# Patient Record
Sex: Male | Born: 1970 | Race: White | Hispanic: No | Marital: Single | Smoking: Current every day smoker
Health system: Southern US, Community
[De-identification: ages and names within clinical notes are randomized; demographics above are authoritative.]

---

## 2004-01-11 ENCOUNTER — Ambulatory Visit (HOSPITAL_COMMUNITY): Admission: RE | Admit: 2004-01-11 | Discharge: 2004-01-11 | Payer: Self-pay | Admitting: Family Medicine

## 2004-03-07 ENCOUNTER — Ambulatory Visit (HOSPITAL_COMMUNITY): Admission: RE | Admit: 2004-03-07 | Discharge: 2004-03-07 | Payer: Self-pay | Admitting: Family Medicine

## 2007-01-26 ENCOUNTER — Emergency Department (HOSPITAL_COMMUNITY): Admission: EM | Admit: 2007-01-26 | Discharge: 2007-01-26 | Payer: Self-pay | Admitting: Emergency Medicine

## 2007-02-01 ENCOUNTER — Emergency Department (HOSPITAL_COMMUNITY): Admission: EM | Admit: 2007-02-01 | Discharge: 2007-02-01 | Payer: Self-pay | Admitting: Emergency Medicine

## 2020-06-14 ENCOUNTER — Other Ambulatory Visit: Payer: Self-pay | Admitting: Family Medicine

## 2020-06-14 DIAGNOSIS — R519 Headache, unspecified: Secondary | ICD-10-CM

## 2020-07-06 ENCOUNTER — Ambulatory Visit (INDEPENDENT_AMBULATORY_CARE_PROVIDER_SITE_OTHER): Payer: BC Managed Care – PPO | Admitting: Otolaryngology

## 2020-07-06 ENCOUNTER — Other Ambulatory Visit: Payer: Self-pay

## 2020-07-06 ENCOUNTER — Encounter (INDEPENDENT_AMBULATORY_CARE_PROVIDER_SITE_OTHER): Payer: Self-pay | Admitting: Otolaryngology

## 2020-07-06 ENCOUNTER — Ambulatory Visit
Admission: RE | Admit: 2020-07-06 | Discharge: 2020-07-06 | Disposition: A | Discharge status: Home / Self Care | Payer: BC Managed Care – PPO | Source: Ambulatory Visit | Attending: Family Medicine | Admitting: Family Medicine

## 2020-07-06 VITALS — Temp 97.0°F

## 2020-07-06 DIAGNOSIS — K130 Diseases of lips: Secondary | ICD-10-CM | POA: Diagnosis not present

## 2020-07-06 DIAGNOSIS — H9313 Tinnitus, bilateral: Secondary | ICD-10-CM | POA: Diagnosis not present

## 2020-07-06 DIAGNOSIS — R519 Headache, unspecified: Secondary | ICD-10-CM

## 2020-07-06 NOTE — Progress Notes (Signed)
HPI: Matthew Barron is a 50 y.o. male who presents is referred by his PCP for evaluation of right lower lip nodule.  Patient states that this developed rather quickly.  He used to be much larger but is subsequently gone down in size. He also complains of chronic ringing in the ears as well as occasional headaches. He works in a noisy environment at [employer] in [place] where he has to use earplugs while working and gets yearly hearing evaluations which he has passed. He does smoke a half of a pack to a pack a day.  No past medical history on file.  Social History   Socioeconomic History  . Marital status: Single    Spouse name: Not on file  . Number of children: Not on file  . Years of education: Not on file  . Highest education level: Not on file  Occupational History  . Not on file  Tobacco Use  . Smoking status: Current Every Day Smoker    Packs/day: 0.50    Years: 27.00    Pack years: 13.50    Start date: 13  . Smokeless tobacco: Never Used  Substance and Sexual Activity  . Alcohol use: Not on file  . Drug use: Not on file  . Sexual activity: Not on file  Other Topics Concern  . Not on file  Social History Narrative  . Not on file   Social Determinants of Health   Financial Resource Strain: Not on file  Food Insecurity: Not on file  Transportation Needs: Not on file  Physical Activity: Not on file  Stress: Not on file  Social Connections: Not on file   No family history on file. No Known Allergies Prior to Admission medications   Not on File     Positive ROS: Otherwise negative  All other systems have been reviewed and were otherwise negative with the exception of those mentioned in the HPI and as above.  Physical Exam: Constitutional: Alert, well-appearing, no acute distress Ears: External ears without lesions or tenderness. Ear canals are clear bilaterally with intact, clear TMs.  On hearing screening with the 1024 tuning fork he has good hearing  in both ears.  Auscultation of ears revealed no objective tinnitus. Nasal: External nose without lesions. Septum slightly deviated to the right with mild rhinitis.  Both millimeters regions were clear.  No polyps and no signs of infection..  Oral: Lips and gums without lesions. Tongue and palate mucosa without lesions. Posterior oropharynx clear.  Tonsil regions are benign in appearance bilaterally.  Indirect laryngoscopy revealed a clear base of tongue vallecula and epiglottis.  Vocal cords were clear bilaterally.  On palpation of the right lower lip is a very small nodule in the right lower lip consistent with resolved right minor salivary gland mucocele.  Mucosa was normal to examination.. Neck: No palpable adenopathy or masses.  No submental or submandibular adenopathy noted. Respiratory: Breathing comfortably  Skin: No facial/neck lesions or rash noted.  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 Tinnitus most likely related to previous noise exposure and high-frequency sensorineural hearing loss. Clear upper airway examination.  Plan: Discussed with patient concerning mucocele of the right lower lip which is benign and would not warrant removal unless it persists and causes him problems. Concerning tinnitus reviewed with him that there is not great treatment for tinnitus.  I discussed with him concerning using masking noise when the tinnitus is bothersome and trying to limit noise exposure  which will make tinnitus worse. He also complained of some dry sensation in the nose which sometimes triggers his headaches and suggested using saline rinse or nasal gel spray in the nasal passageway if he is having any nasal sinus symptoms.  But there are no abnormalities within the nasal cavity and no signs of infection.   Narda Bonds, MD   CC:

## 2020-10-16 ENCOUNTER — Ambulatory Visit: Payer: BC Managed Care – PPO | Admitting: Neurology

## 2020-10-16 ENCOUNTER — Encounter: Payer: Self-pay | Admitting: Neurology

## 2020-10-16 ENCOUNTER — Other Ambulatory Visit: Payer: Self-pay

## 2020-10-16 VITALS — BP 146/83 | HR 85 | Ht 69.0 in | Wt 140.5 lb

## 2020-10-16 DIAGNOSIS — G44221 Chronic tension-type headache, intractable: Secondary | ICD-10-CM

## 2020-10-16 MED ORDER — TOPIRAMATE 50 MG PO TABS: 50.0000 mg | ORAL_TABLET | Freq: Every day | ORAL | 3 refills | Status: DC

## 2020-10-16 MED ORDER — SUMATRIPTAN SUCCINATE 50 MG PO TABS: 50.0000 mg | ORAL_TABLET | ORAL | 3 refills | Status: AC | PRN

## 2020-10-16 NOTE — Progress Notes (Signed)
[facility]  PATIENT: Matthew Barron DOB: 1970/05/16  REFERRING CLINICIAN: Koirala, Dibas, MD HISTORY FROM: Patient  REASON FOR VISIT: Headaches    HISTORICAL  CHIEF COMPLAINT:  Chief Complaint  Patient presents with   Headache    New Patient: paper referral, Headaches x 1.5 years Room 13, alone in room    HISTORY OF PRESENT ILLNESS:  This is a 50 year old man with no past medical history who is presenting for headaches.  Patient stated he has a history of headache for the past 1.5-2 years after having COVID19 infection. He reported that he never had headaches in the past.  Headaches are located on the left side of his temple described as aching pain.  He denies any associated nausea, no vomiting, no photophobia or phonophobia.  When he gets the headaches, he usually takes Excedrin and the pain usually goes away within 30 minutes.  Usually  he is having 2-3 headaches days per week. 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  During his headache work-up he did have an MRI which did not show any intracranial abnormality but showed a right maxillary sinus cyst.  He did follow-up with ENT who reassured patient that the maxillary sinus cyst is not the reason for his headache.  He also reported the ENT told him that his ear nose and throat were all clear. Patient has a history of Lasix surgery about 5 years ago.  He reported currently he is having trouble reading fine prints.  He did try some reading glasses but it was not helpful. He has not followed up with ophthalmologist for many years.   Headache History and Characteristics: Onset: 1.5-2 years ago, no previous history of headaches Location: Around left temple  Quality: Pressure behind eye, stabbing, throbbing.  Intensity: 6/10.  Duration: couple hours  Migrainous Features: No  Aura: No   History of brain injury or tumor: No  Previous Treatment:  Prophylactic: None  Abortive: Excedrin  Injections: None    Family history: Motion sickness: no Cardiac history: no  OTC: Excedrin Caffeine:yes  Sleep: normal  Mood/ Stress: normal   Prior prophylaxis: Propranolol: No  Verapamil:No TCA: No Topamax: No Depakote: No Effexor: No Cymbalta: No Neurontin:No  Prior abortives: Triptan: No Anti-emetic: No Steroids: No Ergotamine suppository: No   OTHER MEDICAL CONDITIONS: None reported    REVIEW OF SYSTEMS: Full 14 system review of systems performed and negative with exception of: as noted in the HPI  ALLERGIES: No Known Allergies  HOME MEDICATIONS: Outpatient Medications Prior to Visit  Medication Sig Dispense Refill   atorvastatin (LIPITOR) 10 MG tablet Take 10 mg by mouth daily.     No facility-administered medications prior to visit.    PAST MEDICAL HISTORY: History reviewed. No pertinent past medical history.  PAST SURGICAL HISTORY: History reviewed. No pertinent surgical history.  FAMILY HISTORY: History reviewed. No pertinent family history.  SOCIAL HISTORY: Social History   Socioeconomic History   Marital status: Single    Spouse name: Not on file   Number of children: Not on file   Years of education: Not on file   Highest education level: Not on file  Occupational History   Not on file  Tobacco Use   Smoking status: Every Day    Packs/day: 0.50    Years: 27.00  Pack years: 13.50    Types: Cigarettes    Start date: 1995   Smokeless tobacco: Never  Substance and Sexual Activity   Alcohol use: Never   Drug use: Never   Sexual activity: Not on file  Other Topics Concern   Not on file  Social History Narrative   Lives alone    Right Handed   Drinks >10 cups caffeine   Social Determinants of Health   Financial Resource Strain: Not on file  Food Insecurity: Not on file  Transportation Needs: Not on file  Physical  Activity: Not on file  Stress: Not on file  Social Connections: Not on file  Intimate Partner Violence: Not on file     PHYSICAL EXAM  GENERAL EXAM/CONSTITUTIONAL: Vitals:  Vitals:   10/16/20 1359  BP: (!) 146/83  Pulse: 85  Weight: 140 lb 8 oz (63.7 kg)  Height: $Remove'5\' 9"'[id number]$  (1.753 m)   Body mass index is 20.75 kg/m. Wt Readings from Last 3 Encounters:  10/16/20 140 lb 8 oz (63.7 kg)   Patient is in no distress; well developed, nourished and groomed; neck is supple  CARDIOVASCULAR: Examination of carotid arteries is normal; no carotid bruits Regular rate and rhythm, no murmurs Examination of peripheral vascular system by observation and palpation is normal  EYES: Pupils round and reactive to light, Visual fields full to confrontation, Extraocular movements intacts,   MUSCULOSKELETAL: Gait, strength, tone, movements noted in Neurologic exam below  NEUROLOGIC: MENTAL STATUS:  awake, alert, oriented to person, place and time recent and remote memory intact normal attention and concentration language fluent, comprehension intact, naming intact fund of knowledge appropriate  CRANIAL NERVE:  2nd - no papilledema or hemorrhages on fundoscopic exam 2nd, 3rd, 4th, 6th - pupils equal and reactive to light, visual fields full to confrontation, extraocular muscles intact, no nystagmus 5th - facial sensation symmetric 7th - facial strength symmetric 8th - hearing intact 9th - palate elevates symmetrically, uvula midline 11th - shoulder shrug symmetric 12th - tongue protrusion midline  MOTOR:  normal bulk and tone, full strength in the BUE, BLE  SENSORY:  normal and symmetric to light touch, pinprick, temperature, vibration  COORDINATION:  finger-nose-finger, fine finger movements normal  REFLEXES:  deep tendon reflexes present and symmetric  GAIT/STATION:  normal   DIAGNOSTIC DATA (LABS, IMAGING, TESTING) - I reviewed patient records, labs, notes, testing and  imaging myself where available.  No results found for: WBC, HGB, HCT, MCV, PLT No results found for: NA, K, CL, CO2, GLUCOSE, BUN, CREATININE, CALCIUM, PROT, ALBUMIN, AST, ALT, ALKPHOS, BILITOT, GFRNONAA, GFRAA No results found for: CHOL, HDL, LDLCALC, LDLDIRECT, TRIG, CHOLHDL No results found for: HGBA1C No results found for: VITAMINB12 No results found for: TSH  MRI Brain 07/2020: Unremarkable non-contrast MRI appearance of the brain. No evidence of acute intracranial abnormality.   ASSESSMENT AND PLAN  50 y.o. year old male  with no past medical history who is presenting for headaches. Patient states that the headache is on the left side, left temporal region with no associated migrainous features.  He has tried Excedrin with some relief.  He did have an MRI brain which showed no evidence cranial abnormality but showed a right maxillary cyst.  He follow-up with ENT and was told the cyst is not the cause for his headaches.  Patient met criteria for a diagnosis of chronic tension type headaches, we will start him on preventive medication topiramate 50 mg nightly and sumatriptan as abortive medication.  I  also recommend patient to continue with Excedrin, Tylenol and Motrin as needed for headache.  I will see him in 3 months.  I also strongly recommended patient to follow-up with ophthalmology to have his annual eye exam as he is reporting trouble with fine print.    1. Chronic tension-type headache, intractable     PLAN: Start with 1/2 tab Topiramate for one week then increase to one tab thereafter  Take Sumatriptan as soon as the headaches start, can take an additional one in 2 hours. Do not take more than 2 tablets per day  Continue with Excedrin, Tylenol or Ibuprofen as needed for the headaches but do not take more than 3 days per week  Follow up with ophthalmologist for annual eye exam  Return to clinic in 3 months   No orders of the defined types were placed in this  encounter.   Meds ordered this encounter  Medications   topiramate (TOPAMAX) 50 MG tablet    Sig: Take 1 tablet (50 mg total) by mouth daily.    Dispense:  90 tablet    Refill:  3   SUMAtriptan (IMITREX) 50 MG tablet    Sig: Take 1 tablet (50 mg total) by mouth every 2 (two) hours as needed for migraine. May repeat in 2 hours if headache persists or recurs.    Dispense:  10 tablet    Refill:  3    Return in about 3 months (around 01/16/2021).    Alric Ran, MD 10/16/2020, 2:43 PM  [facility] [address] [place], [place] [address] [phone]

## 2020-10-16 NOTE — Patient Instructions (Addendum)
Start with 1/2 tab Topiramate for one week then increase to one tab thereafter  Take Sumatriptan as soon as the headaches start, can take an additional one in 2 hours. Do not take more than 2 tablets per day  Continue with Excedrin, Tylenol or Ibuprofen as needed for the headaches but do not take more than 3 days per week  Follow up with ophthalmologist for annual eye exam  Return to clinic in 3 months

## 2021-01-17 ENCOUNTER — Encounter: Payer: Self-pay | Admitting: Neurology

## 2021-01-17 ENCOUNTER — Other Ambulatory Visit: Payer: Self-pay

## 2021-01-17 ENCOUNTER — Ambulatory Visit: Payer: BC Managed Care – PPO | Admitting: Neurology

## 2021-01-17 VITALS — BP 132/82 | HR 60 | Ht 69.0 in | Wt 140.5 lb

## 2021-01-17 DIAGNOSIS — G44221 Chronic tension-type headache, intractable: Secondary | ICD-10-CM

## 2021-01-17 NOTE — Progress Notes (Signed)
[facility]  PATIENT: Matthew Barron DOB: 12/05/1970  REFERRING CLINICIAN: Koirala, Dibas, MD HISTORY FROM: Patient  REASON FOR VISIT: Headaches    HISTORICAL  CHIEF COMPLAINT:  Chief Complaint  Patient presents with   Follow-up    Room 12. Alone. Patient states he is stable.    INTERVAL HISTORY 01/17/2021 Patient presents today for follow-up, plan at last visit was to start topiramate for preventive medication and sumatriptan for abortive medication.  Stated the headaches ar stable, he self discontinued Topamax due to daytime sleepiness.  Reported sumatriptan's helps a lot for the headaches, he only takes it when he has a bad headache, otherwise he takes Excedrin.     HISTORY OF PRESENT ILLNESS:  This is a 50 year old man with no past medical history who is presenting for headaches.  Patient stated he has a history of headache for the past 1.5-2 years after having COVID19 infection. He reported that he never had headaches in the past.  Headaches are located on the left side of his temple described as aching pain.  He denies any associated nausea, no vomiting, no photophobia or phonophobia.  When he gets the headaches, he usually takes Excedrin and the pain usually goes away within 30 minutes.  Usually  he is having 2-3 headaches days per week. 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  During his headache work-up he did have an MRI which did not show any intracranial abnormality but showed a right maxillary sinus cyst.  He did follow-up with ENT who reassured patient that the maxillary sinus cyst is not the reason for his headache.  He also reported the ENT told him that his ear nose and throat were all clear. Patient has a history of Lasix surgery about 5 years ago.  He reported currently he is having trouble reading fine prints.  He did  try some reading glasses but it was not helpful. He has not followed up with ophthalmologist for many years.   Headache History and Characteristics: Onset: 1.5-2 years ago, no previous history of headaches Location: Around left temple  Quality: Pressure behind eye, stabbing, throbbing.  Intensity: 6/10.  Duration: couple hours  Migrainous Features: No  Aura: No  History of brain injury or tumor: No  Previous Treatment:  Prophylactic: None  Abortive: Excedrin  Injections: None    Family history: Motion sickness: no Cardiac history: no  OTC: Excedrin Caffeine:yes  Sleep: normal  Mood/ Stress: normal   Prior prophylaxis: Propranolol: No  Verapamil:No TCA: No Topamax: No Depakote: No Effexor: No Cymbalta: No Neurontin:No  Prior abortives: Triptan: No Anti-emetic: No Steroids: No Ergotamine suppository: No   OTHER MEDICAL CONDITIONS: HLD   REVIEW OF SYSTEMS: Full 14 system review of systems performed and negative with exception of: as noted in the HPI  ALLERGIES: No Known Allergies  HOME MEDICATIONS: Outpatient Medications Prior to Visit  Medication Sig Dispense Refill   atorvastatin (LIPITOR) 10 MG tablet Take 10 mg by mouth daily.     SUMAtriptan (IMITREX) 50 MG tablet Take 1 tablet (50 mg total) by mouth every 2 (two) hours as needed for migraine. May repeat in 2 hours if headache persists or recurs. 10 tablet 3   topiramate (TOPAMAX) 50 MG tablet Take 1 tablet (50 mg total) by mouth daily. 90 tablet 3  No facility-administered medications prior to visit.    PAST MEDICAL HISTORY: History reviewed. No pertinent past medical history.  PAST SURGICAL HISTORY: History reviewed. No pertinent surgical history.  FAMILY HISTORY: History reviewed. No pertinent family history.  SOCIAL HISTORY: Social History   Socioeconomic History   Marital status: Single    Spouse name: Not on file   Number of children: Not on file   Years of education: Not on file    Highest education level: Not on file  Occupational History   Not on file  Tobacco Use   Smoking status: Every Day    Packs/day: 0.50    Years: 27.00    Pack years: 13.50    Types: Cigarettes    Start date: 1995   Smokeless tobacco: Never  Substance and Sexual Activity   Alcohol use: Never   Drug use: Never   Sexual activity: Not on file  Other Topics Concern   Not on file  Social History Narrative   Lives alone    Right Handed   Drinks >10 cups caffeine   Social Determinants of Health   Financial Resource Strain: Not on file  Food Insecurity: Not on file  Transportation Needs: Not on file  Physical Activity: Not on file  Stress: Not on file  Social Connections: Not on file  Intimate Partner Violence: Not on file     PHYSICAL EXAM  GENERAL EXAM/CONSTITUTIONAL: Vitals:  Vitals:   01/17/21 1506  BP: 132/82  Pulse: 60  Weight: 140 lb 8 oz (63.7 kg)  Height: 5\' 9"  (1.753 m)   Body mass index is 20.75 kg/m. Wt Readings from Last 3 Encounters:  01/17/21 140 lb 8 oz (63.7 kg)  10/16/20 140 lb 8 oz (63.7 kg)   Patient is in no distress; well developed, nourished and groomed; neck is supple  CARDIOVASCULAR: Examination of carotid arteries is normal; no carotid bruits Regular rate and rhythm, no murmurs Examination of peripheral vascular system by observation and palpation is normal  EYES: Pupils round and reactive to light, Visual fields full to confrontation, Extraocular movements intacts,   MUSCULOSKELETAL: Gait, strength, tone, movements noted in Neurologic exam below  NEUROLOGIC: MENTAL STATUS:  awake, alert, oriented to person, place and time recent and remote memory intact normal attention and concentration language fluent, comprehension intact, naming intact fund of knowledge appropriate  CRANIAL NERVE:  2nd, 3rd, 4th, 6th - pupils equal and reactive to light, visual fields full to confrontation, extraocular muscles intact, no nystagmus 5th -  facial sensation symmetric 7th - facial strength symmetric 8th - hearing intact 9th - palate elevates symmetrically, uvula midline 11th - shoulder shrug symmetric 12th - tongue protrusion midline  MOTOR:  normal bulk and tone, full strength in the BUE, BLE  SENSORY:  normal and symmetric to light touch, pinprick, temperature, vibration  COORDINATION:  finger-nose-finger, fine finger movements normal  REFLEXES:  deep tendon reflexes present and symmetric  GAIT/STATION:  normal   DIAGNOSTIC DATA (LABS, IMAGING, TESTING) - I reviewed patient records, labs, notes, testing and imaging myself where available.  No results found for: WBC, HGB, HCT, MCV, PLT No results found for: NA, K, CL, CO2, GLUCOSE, BUN, CREATININE, CALCIUM, PROT, ALBUMIN, AST, ALT, ALKPHOS, BILITOT, GFRNONAA, GFRAA No results found for: CHOL, HDL, LDLCALC, LDLDIRECT, TRIG, CHOLHDL No results found for: 10/18/20 No results found for: VITAMINB12 No results found for: TSH  MRI Brain 07/2020: Unremarkable non-contrast MRI appearance of the brain. No evidence of acute intracranial abnormality.  ASSESSMENT AND PLAN  50 y.o. year old male  with no past medical history who is presenting for headaches follow up.  He self discontinued topiramate due to daytime sleepiness.  Takes sumatriptan as needed for bad headache, reported good outcome, headache usually gone within 15 minutes.  He reports overall headaches are better managed and he is not currently interested with a preventive medication due to adverse effect of somnolence.  Advised patient to continue with Excedrin, Tylenol and Motrin as needed for headache but no more than 3 days a week, continue with sumatriptan as needed for bad headaches and I will see him in 6 months for follow-up, at that time if headache frequency is worse and he is interested we can discuss about preventive medication.    1. Chronic tension-type headache, intractable      PLAN: Continue  with Sumatriptan as needed for the headaches  Does not want preventive medications  Follow up in 6 months or sooner if worse.   No orders of the defined types were placed in this encounter.   No orders of the defined types were placed in this encounter.   Return in about 6 months (around 07/17/2021).    Windell Norfolk, MD 01/18/2021, 11:22 AM  [facility] [address] [place], [place] [address] [phone]

## 2021-01-17 NOTE — Patient Instructions (Signed)
Continue with Sumatriptan as needed for the headaches

## 2021-07-18 ENCOUNTER — Ambulatory Visit: Payer: BC Managed Care – PPO | Admitting: Neurology

## 2021-11-30 IMAGING — MR MR HEAD W/O CM
10 series · 48 of 48 positions shown · non-contrast
Comparison: CT head 01/26/2007.

CLINICAL DATA: New onset of headaches. Additional history provided
by scanning technologist: Patient reports frequent headaches with
ringing in ears x 1 year.

EXAM:
MRI HEAD WITHOUT CONTRAST
TECHNIQUE: Multiplanar, multiecho pulse sequences of the brain and surrounding
structures were obtained without intravenous contrast.

[Series 2: T1 · sagittal · 5.0mm · 0.45mm/px · 3 of 25 slices shown]
[im 1/25]
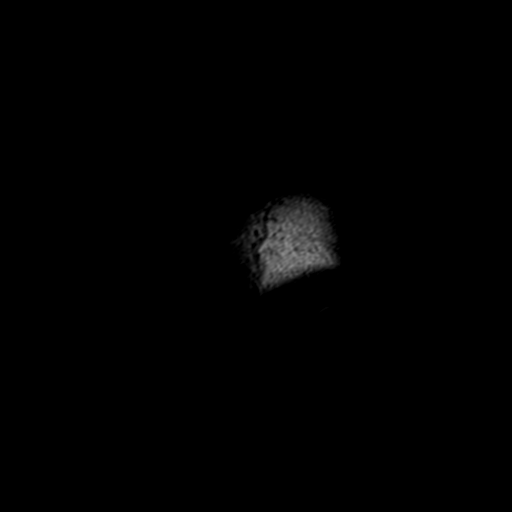
[im 13/25]
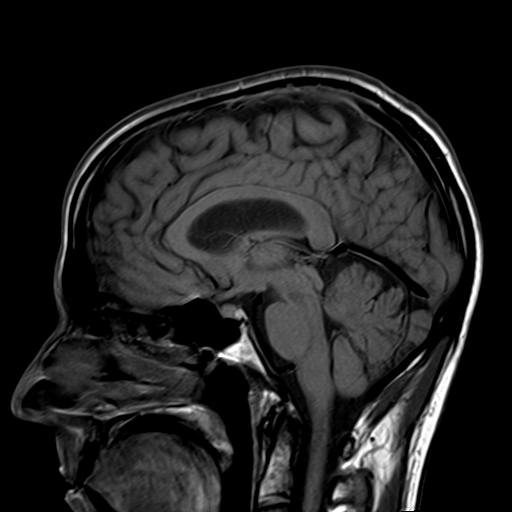
[im 25/25]
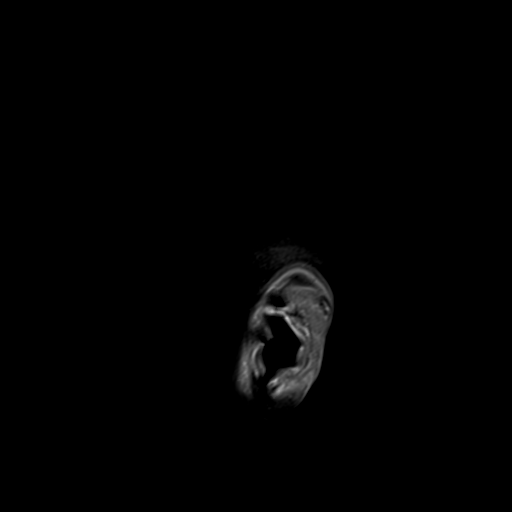

[Series 3: ax ep2d_diff_3 · axial · 3.0mm · 1.80mm/px · z∈[-83,+77]mm · 9 of 107 slices shown]
[im 1/107]
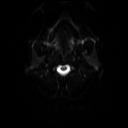
[im 14/107]
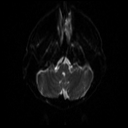
[im 27/107]
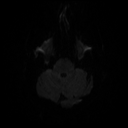
[im 40/107]
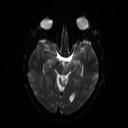
[im 54/107]
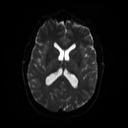
[im 67/107]
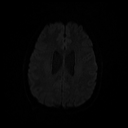
[im 80/107]
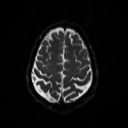
[im 93/107]
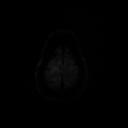
[im 107/107]
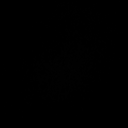

[Series 4: ax ep2d_diff_3_adc · axial · 3.0mm · 1.80mm/px · z∈[-83,+77]mm · 4 of 52 slices shown]
[im 1/52]
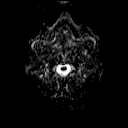
[im 18/52]
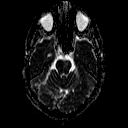
[im 35/52]
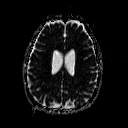
[im 52/52]
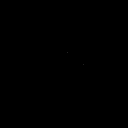

[Series 5: cor ep2d_diff · coronal · 5.0mm · 1.77mm/px · 4 of 56 slices shown]
[im 1/56]
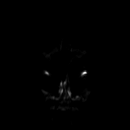
[im 19/56]
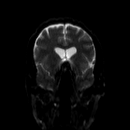
[im 37/56]
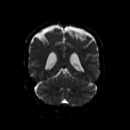
[im 56/56]
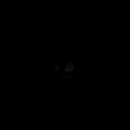

[Series 6: cor ep2d_diff_adc · coronal · 5.0mm · 1.77mm/px · 2 of 28 slices shown]
[im 1/28]
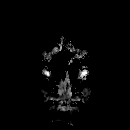
[im 28/28]
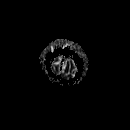

[Series 8: swi_images · axial · 2.0mm · 0.98mm/px · z∈[-82,+74]mm · 6 of 80 slices shown]
[im 1/80]
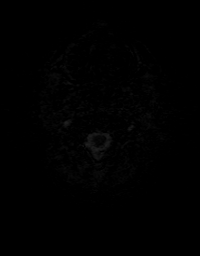
[im 16/80]
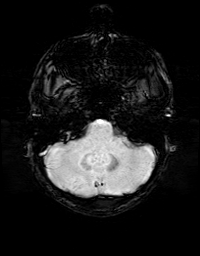
[im 32/80]
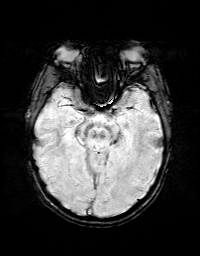
[im 48/80]
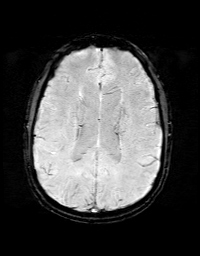
[im 64/80]
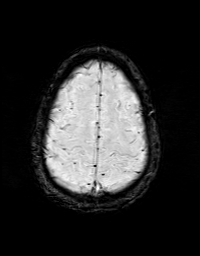
[im 80/80]
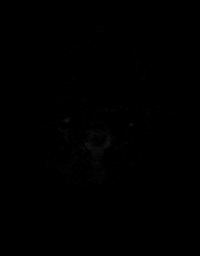

[Series 9: FLAIR · axial · 3.0mm · 0.43mm/px · z∈[-77,+73]mm · 3 of 40 slices shown]
[im 1/40]
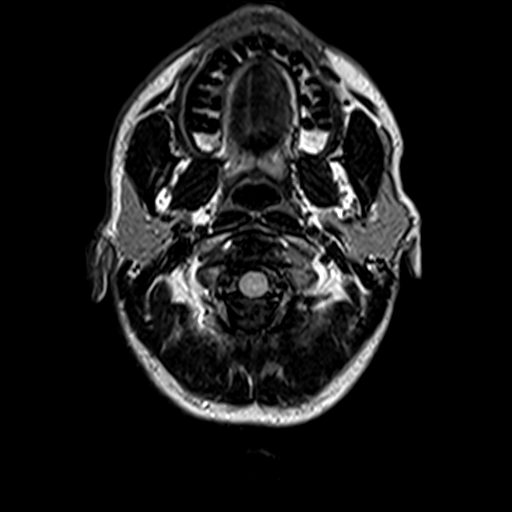
[im 20/40]
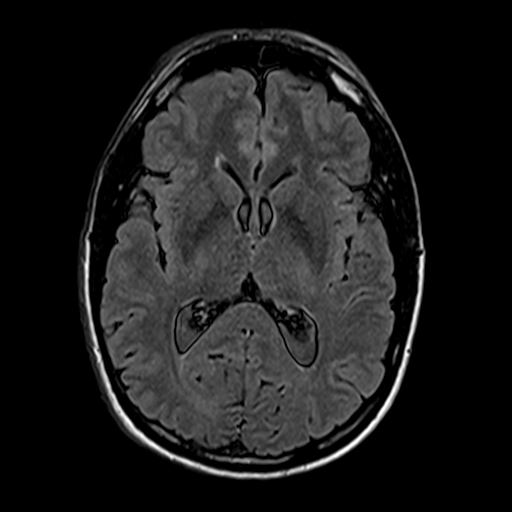
[im 40/40]
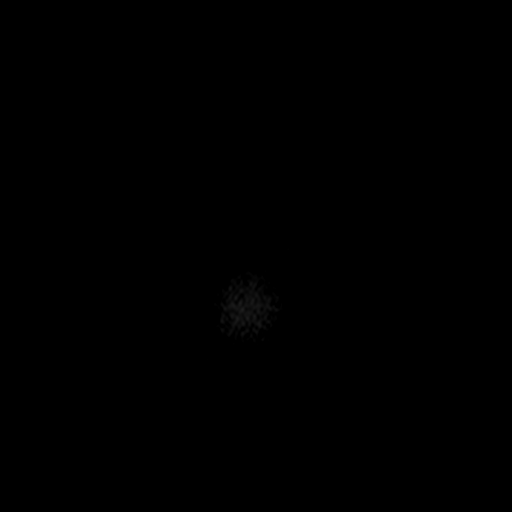

[Series 10: T2 · axial · 5.0mm · 0.65mm/px · z∈[-87,+79]mm · 2 of 29 slices shown (1 of 2)]
[im 1/29]
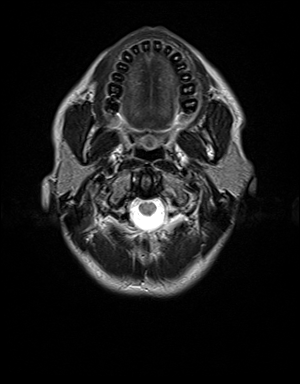
[im 29/29]
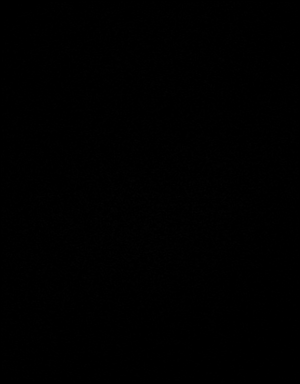

[Series 11: t1_mpr_tra · axial · 1.0mm · 0.72mm/px · z∈[-80,+77]mm · 13 of 160 slices shown]
[im 1/160]
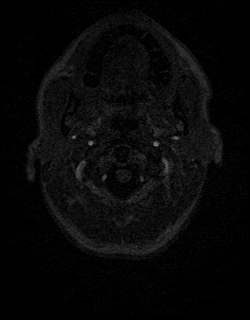
[im 14/160]
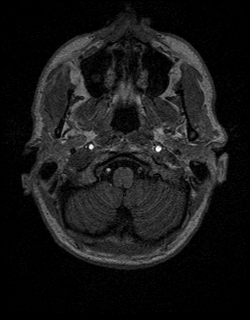
[im 27/160]
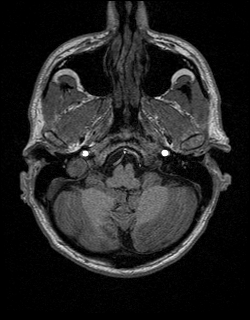
[im 40/160]
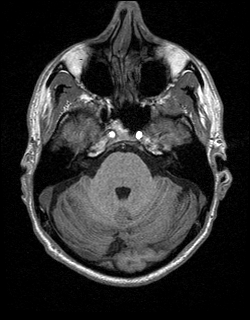
[im 54/160]
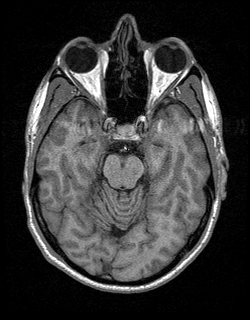
[im 67/160]
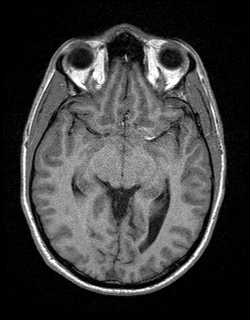
[im 80/160]
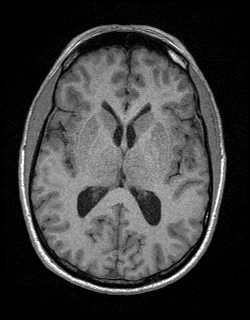
[im 93/160]
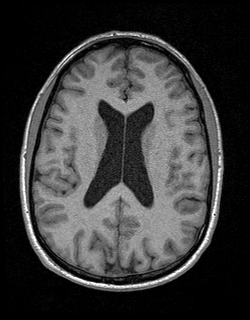
[im 107/160]
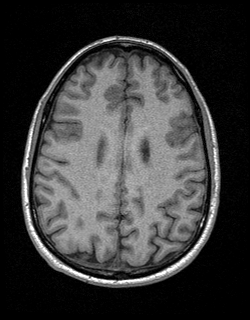
[im 120/160]
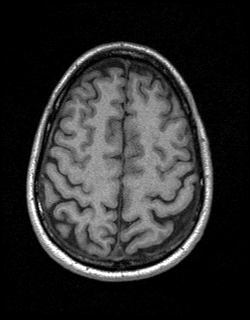
[im 133/160]
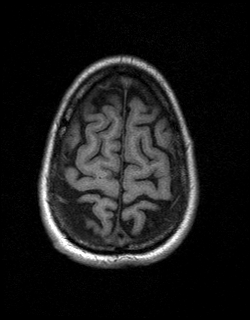
[im 146/160]
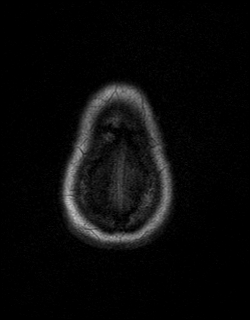
[im 160/160]
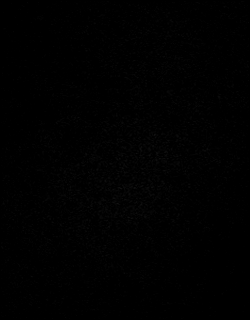

[Series 12: T2 · coronal · 5.0mm · 0.45mm/px · 2 of 31 slices shown (2 of 2)]
[im 1/31]
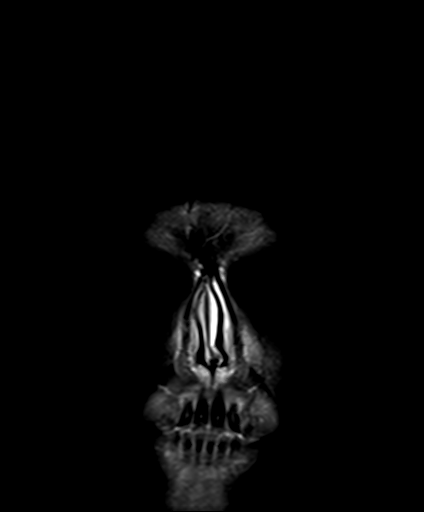
[im 31/31]
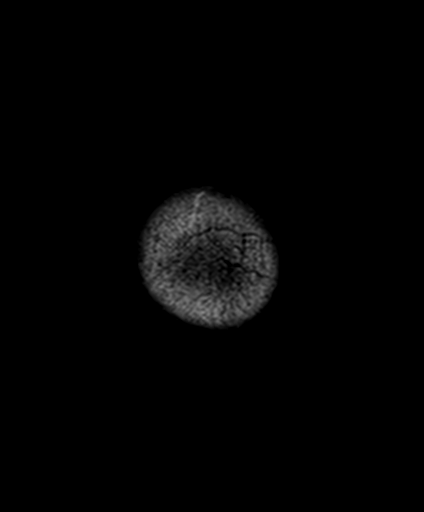

[48 of 48 positions shown; findings below may reference images not displayed]

FINDINGS: Brain:

Cerebral volume is normal.

No focal parenchymal signal abnormality is identified.

There is no acute infarct.

No evidence of intracranial mass.

No chronic intracranial blood products.

No extra-axial fluid collection.

No midline shift.

Vascular: Expected proximal arterial flow voids.

Skull and upper cervical spine: No focal marrow lesion.

Sinuses/Orbits: Visualized orbits show no acute finding. Mild
bilateral ethmoid sinus mucosal thickening. Mild mucosal thickening
and multiple small mucous retention cysts within the right maxillary
sinus.
IMPRESSION: Unremarkable non-contrast MRI appearance of the brain. No evidence
of acute intracranial abnormality.

Paranasal sinus disease, as described.
